# Patient Record
Sex: Female | Born: 1975 | Race: White | Hispanic: No | Marital: Single | Smoking: Former smoker
Health system: Southern US, Community
[De-identification: ages and names within clinical notes are randomized; demographics above are authoritative.]

## PROBLEM LIST (undated history)

## (undated) DIAGNOSIS — Z6791 Unspecified blood type, Rh negative: Secondary | ICD-10-CM

## (undated) DIAGNOSIS — I10 Essential (primary) hypertension: Secondary | ICD-10-CM

## (undated) DIAGNOSIS — O26899 Other specified pregnancy related conditions, unspecified trimester: Secondary | ICD-10-CM

## (undated) HISTORY — DX: Other specified pregnancy related conditions, unspecified trimester: O26.899

## (undated) HISTORY — DX: Unspecified blood type, rh negative: Z67.91

---

## 2006-09-11 ENCOUNTER — Emergency Department: Payer: Self-pay | Admitting: Emergency Medicine

## 2018-02-22 ENCOUNTER — Encounter (HOSPITAL_COMMUNITY): Payer: Self-pay

## 2018-02-22 ENCOUNTER — Ambulatory Visit (HOSPITAL_COMMUNITY)
Admission: AD | Admit: 2018-02-22 | Discharge: 2018-02-23 | Disposition: A | Discharge status: Home / Self Care | Payer: Medicaid Other | Source: Ambulatory Visit | Attending: Obstetrics and Gynecology | Admitting: Obstetrics and Gynecology

## 2018-02-22 DIAGNOSIS — I10 Essential (primary) hypertension: Secondary | ICD-10-CM | POA: Insufficient documentation

## 2018-02-22 DIAGNOSIS — O200 Threatened abortion: Secondary | ICD-10-CM | POA: Diagnosis present

## 2018-02-22 DIAGNOSIS — O3680X0 Pregnancy with inconclusive fetal viability, not applicable or unspecified: Secondary | ICD-10-CM

## 2018-02-22 DIAGNOSIS — O26899 Other specified pregnancy related conditions, unspecified trimester: Secondary | ICD-10-CM

## 2018-02-22 DIAGNOSIS — Z91120 Patient's intentional underdosing of medication regimen due to financial hardship: Secondary | ICD-10-CM | POA: Insufficient documentation

## 2018-02-22 DIAGNOSIS — O034 Incomplete spontaneous abortion without complication: Secondary | ICD-10-CM | POA: Diagnosis present

## 2018-02-22 DIAGNOSIS — Z87891 Personal history of nicotine dependence: Secondary | ICD-10-CM | POA: Insufficient documentation

## 2018-02-22 DIAGNOSIS — O09519 Supervision of elderly primigravida, unspecified trimester: Secondary | ICD-10-CM

## 2018-02-22 DIAGNOSIS — T50906A Underdosing of unspecified drugs, medicaments and biological substances, initial encounter: Secondary | ICD-10-CM | POA: Insufficient documentation

## 2018-02-22 DIAGNOSIS — O209 Hemorrhage in early pregnancy, unspecified: Secondary | ICD-10-CM

## 2018-02-22 DIAGNOSIS — F1210 Cannabis abuse, uncomplicated: Secondary | ICD-10-CM | POA: Diagnosis present

## 2018-02-22 DIAGNOSIS — Z9889 Other specified postprocedural states: Secondary | ICD-10-CM

## 2018-02-22 DIAGNOSIS — Z6791 Unspecified blood type, Rh negative: Secondary | ICD-10-CM

## 2018-02-22 HISTORY — DX: Essential (primary) hypertension: I10

## 2018-02-22 LAB — CBC
HCT: 38.5 % (ref 36.0–46.0)
Hemoglobin: 13.3 g/dL (ref 12.0–15.0)
MCH: 31.1 pg (ref 26.0–34.0)
MCHC: 34.5 g/dL (ref 30.0–36.0)
MCV: 90.2 fL (ref 80.0–100.0)
Platelets: 270 10*3/uL (ref 150–400)
RBC: 4.27 MIL/uL (ref 3.87–5.11)
RDW: 13.1 % (ref 11.5–15.5)
WBC: 12.4 10*3/uL — ABNORMAL HIGH (ref 4.0–10.5)
nRBC: 0 % (ref 0.0–0.2)

## 2018-02-22 LAB — POCT PREGNANCY, URINE: Preg Test, Ur: POSITIVE — AB

## 2018-02-22 MED ORDER — LACTATED RINGERS IV SOLN
INTRAVENOUS | Status: DC
  Administered 2018-02-22 – 2018-02-23 (×2): via INTRAVENOUS

## 2018-02-22 MED ORDER — PROMETHAZINE HCL 25 MG/ML IJ SOLN
12.5000 mg | Freq: Once | INTRAMUSCULAR | Status: AC
  Administered 2018-02-22: 12.5 mg via INTRAVENOUS
  Filled 2018-02-22: qty 1

## 2018-02-22 MED ORDER — HYDROMORPHONE HCL 1 MG/ML IJ SOLN
1.0000 mg | INTRAMUSCULAR | Status: DC | PRN
  Administered 2018-02-22: 1 mg via INTRAVENOUS
  Filled 2018-02-22: qty 1

## 2018-02-22 NOTE — MAU Note (Signed)
Vaginal bleeding and abdominal cramping that started around 1900.  States she is about [redacted] weeks pregnant.  LMP end of July/beginning of August.

## 2018-02-22 NOTE — MAU Provider Note (Signed)
First Provider Initiated Contact with Patient 02/22/18 2247      Chief Complaint:  Vaginal Bleeding and Abdominal Pain   Vanessa Simmons is  42 y.o. G1P0010 at Unknown presents complaining of Vaginal Bleeding and Abdominal Pain . She is appx [redacted] weeks pregnant by LMP, hasn't had an [place] yet.  Started spotting this am, wasn't too concerned.  THen around 1900, she started having severe intermittent abdominal cramping and the bleeding increased. Also having some nausea and vomiting. Has CHTN, not on meds "for a while" d/t not having insurance. Had a TAB in 2000.   Obstetrical/Gynecological History: OB History    Gravida  1   Para      Term      Preterm      AB  1   Living        SAB      TAB  1   Ectopic      Multiple      Live Births             Past Medical History: Past Medical History:  Diagnosis Date  . Hypertension     Past Surgical History: History reviewed. No pertinent surgical history.  Family History: No family history on file.  Social History: Social History   Tobacco Use  . Smoking status: Not on file  Substance Use Topics  . Alcohol use: Not on file  . Drug use: Not on file    Allergies: Not on File  Meds:  No medications prior to admission.    Review of Systems   Constitutional: Negative for fever and chills Eyes: Negative for visual disturbances Respiratory: Negative for shortness of breath, dyspnea Cardiovascular: Negative for chest pain or palpitations  Gastrointestinal: Negative for diarrhea or constipation Genitourinary: Negative for dysuria and urgency Musculoskeletal: Negative for back pain, joint pain, myalgias.  Normal ROM  Neurological: Negative for dizziness and headaches    Physical Exam  Blood pressure (!) 159/98, pulse 69, temperature 97.9 F (36.6 C), resp. rate 20, height 5\' 5"  (1.651 m), weight 77.7 kg. GENERAL: Well-developed, well-nourished female in no acute distress.  LUNGS: respiratory effort  unlabored HEART: Regular rate and rhythm. ABDOMEN: Soft, nontender EXTREMITIES: Nontender, no edema, 2+ distal pulses. DTR's 2+ PELVIC:  Moderate amount of bright red blood on inner thighs and vagina.  SSE:  No active bleeding noted; clots of bright red blood in vault.  CERVICAL EXAM: firm, anterior, closed.   Labs: No results found for this or any previous visit (from the past 24 hour(s)). Imaging Studies:  No results found.  Assessment: Vanessa Simmons is  42 y.o. G1P0010 a appx 9 weeks, empty sac.  Still bleeding, Dr Vergie Living called to assess. .  Plan: counseled pt for [facility]  Scarlette Calico Cresenzo-Dishmon 10/17/201911:11 PM

## 2018-02-23 ENCOUNTER — Inpatient Hospital Stay (HOSPITAL_COMMUNITY): Payer: Medicaid Other | Admitting: Anesthesiology

## 2018-02-23 ENCOUNTER — Inpatient Hospital Stay (HOSPITAL_COMMUNITY): Payer: Medicaid Other

## 2018-02-23 ENCOUNTER — Encounter (HOSPITAL_COMMUNITY)
Admission: AD | Disposition: A | Discharge status: Home / Self Care | Payer: Self-pay | Source: Ambulatory Visit | Attending: Obstetrics and Gynecology

## 2018-02-23 DIAGNOSIS — Z6791 Unspecified blood type, Rh negative: Secondary | ICD-10-CM

## 2018-02-23 DIAGNOSIS — I10 Essential (primary) hypertension: Secondary | ICD-10-CM | POA: Diagnosis not present

## 2018-02-23 DIAGNOSIS — O034 Incomplete spontaneous abortion without complication: Secondary | ICD-10-CM

## 2018-02-23 DIAGNOSIS — O200 Threatened abortion: Secondary | ICD-10-CM | POA: Diagnosis present

## 2018-02-23 DIAGNOSIS — O09519 Supervision of elderly primigravida, unspecified trimester: Secondary | ICD-10-CM

## 2018-02-23 DIAGNOSIS — F1210 Cannabis abuse, uncomplicated: Secondary | ICD-10-CM | POA: Diagnosis present

## 2018-02-23 DIAGNOSIS — O3680X0 Pregnancy with inconclusive fetal viability, not applicable or unspecified: Secondary | ICD-10-CM

## 2018-02-23 DIAGNOSIS — T50906A Underdosing of unspecified drugs, medicaments and biological substances, initial encounter: Secondary | ICD-10-CM | POA: Diagnosis not present

## 2018-02-23 DIAGNOSIS — Z91120 Patient's intentional underdosing of medication regimen due to financial hardship: Secondary | ICD-10-CM | POA: Diagnosis not present

## 2018-02-23 DIAGNOSIS — O26899 Other specified pregnancy related conditions, unspecified trimester: Secondary | ICD-10-CM

## 2018-02-23 HISTORY — DX: Unspecified blood type, rh negative: Z67.91

## 2018-02-23 HISTORY — PX: DILATION AND EVACUATION: SHX1459

## 2018-02-23 LAB — URINALYSIS, ROUTINE W REFLEX MICROSCOPIC
Bacteria, UA: NONE SEEN
Bilirubin Urine: NEGATIVE
GLUCOSE, UA: NEGATIVE mg/dL
Ketones, ur: 20 mg/dL — AB
LEUKOCYTES UA: NEGATIVE
NITRITE: NEGATIVE
PROTEIN: NEGATIVE mg/dL
Specific Gravity, Urine: 1.025 (ref 1.005–1.030)
pH: 5 (ref 5.0–8.0)

## 2018-02-23 LAB — ABO/RH: ABO/RH(D): A NEG

## 2018-02-23 LAB — RAPID URINE DRUG SCREEN, HOSP PERFORMED
Amphetamines: NOT DETECTED
BARBITURATES: NOT DETECTED
BENZODIAZEPINES: NOT DETECTED
Cocaine: NOT DETECTED
Opiates: NOT DETECTED
Tetrahydrocannabinol: POSITIVE — AB

## 2018-02-23 LAB — HCG, QUANTITATIVE, PREGNANCY: HCG, BETA CHAIN, QUANT, S: 3354 m[IU]/mL — AB (ref <5)

## 2018-02-23 LAB — TYPE AND SCREEN
ABO/RH(D): A NEG
Antibody Screen: NEGATIVE

## 2018-02-23 SURGERY — DILATION AND EVACUATION, UTERUS
Anesthesia: General | Site: Vagina

## 2018-02-23 MED ORDER — PROPOFOL 10 MG/ML IV BOLUS
INTRAVENOUS | Status: DC | PRN
  Administered 2018-02-23: 150 mg via INTRAVENOUS

## 2018-02-23 MED ORDER — ACETAMINOPHEN 500 MG PO TABS: 500.0000 mg | ORAL_TABLET | Freq: Four times a day (QID) | ORAL | 0 refills | Status: DC | PRN

## 2018-02-23 MED ORDER — FENTANYL CITRATE (PF) 100 MCG/2ML IJ SOLN
INTRAMUSCULAR | Status: DC | PRN
  Administered 2018-02-23: 25 ug via INTRAVENOUS

## 2018-02-23 MED ORDER — DEXAMETHASONE SODIUM PHOSPHATE 10 MG/ML IJ SOLN
INTRAMUSCULAR | Status: DC | PRN
  Administered 2018-02-23: 4 mg via INTRAVENOUS

## 2018-02-23 MED ORDER — LIDOCAINE HCL 1 % IJ SOLN
INTRAMUSCULAR | Status: DC | PRN
  Administered 2018-02-23: 20 mL

## 2018-02-23 MED ORDER — LIDOCAINE HCL (CARDIAC) PF 100 MG/5ML IV SOSY
PREFILLED_SYRINGE | INTRAVENOUS | Status: AC
  Filled 2018-02-23: qty 5

## 2018-02-23 MED ORDER — ONDANSETRON HCL 4 MG/2ML IJ SOLN
INTRAMUSCULAR | Status: DC | PRN
  Administered 2018-02-23: 4 mg via INTRAVENOUS

## 2018-02-23 MED ORDER — MIDAZOLAM HCL 2 MG/2ML IJ SOLN
INTRAMUSCULAR | Status: AC
  Filled 2018-02-23: qty 2

## 2018-02-23 MED ORDER — MIDAZOLAM HCL 2 MG/2ML IJ SOLN
INTRAMUSCULAR | Status: DC | PRN
  Administered 2018-02-23: 2 mg via INTRAVENOUS

## 2018-02-23 MED ORDER — OXYCODONE-ACETAMINOPHEN 5-325 MG PO TABS: 1.0000 | ORAL_TABLET | Freq: Four times a day (QID) | ORAL | 0 refills | Status: DC | PRN

## 2018-02-23 MED ORDER — HYDROMORPHONE HCL 1 MG/ML IJ SOLN: 0.2500 mg | INTRAMUSCULAR | Status: DC | PRN

## 2018-02-23 MED ORDER — SUCCINYLCHOLINE CHLORIDE 200 MG/10ML IV SOSY
PREFILLED_SYRINGE | INTRAVENOUS | Status: AC
  Filled 2018-02-23: qty 10

## 2018-02-23 MED ORDER — FENTANYL CITRATE (PF) 100 MCG/2ML IJ SOLN: 50.0000 ug | Freq: Once | INTRAMUSCULAR | Status: DC

## 2018-02-23 MED ORDER — EPHEDRINE SULFATE 50 MG/ML IJ SOLN
INTRAMUSCULAR | Status: DC | PRN
  Administered 2018-02-23 (×2): 5 mg via INTRAVENOUS

## 2018-02-23 MED ORDER — PROPOFOL 10 MG/ML IV BOLUS
INTRAVENOUS | Status: AC
  Filled 2018-02-23: qty 20

## 2018-02-23 MED ORDER — FENTANYL CITRATE (PF) 100 MCG/2ML IJ SOLN
INTRAMUSCULAR | Status: AC
  Filled 2018-02-23: qty 2

## 2018-02-23 MED ORDER — ONDANSETRON HCL 4 MG/2ML IJ SOLN
INTRAMUSCULAR | Status: AC
  Filled 2018-02-23: qty 2

## 2018-02-23 MED ORDER — RHO D IMMUNE GLOBULIN 1500 UNIT/2ML IJ SOSY
300.0000 ug | PREFILLED_SYRINGE | Freq: Once | INTRAMUSCULAR | Status: AC
  Administered 2018-02-23: 300 ug via INTRAVENOUS
  Filled 2018-02-23: qty 2

## 2018-02-23 MED ORDER — IBUPROFEN 600 MG PO TABS: 600.0000 mg | ORAL_TABLET | Freq: Four times a day (QID) | ORAL | 3 refills | Status: AC | PRN

## 2018-02-23 MED ORDER — EPHEDRINE 5 MG/ML INJ
INTRAVENOUS | Status: AC
  Filled 2018-02-23: qty 10

## 2018-02-23 MED ORDER — LIDOCAINE HCL (CARDIAC) PF 100 MG/5ML IV SOSY
PREFILLED_SYRINGE | INTRAVENOUS | Status: DC | PRN
  Administered 2018-02-23: 100 mg via INTRAVENOUS

## 2018-02-23 MED ORDER — DEXAMETHASONE SODIUM PHOSPHATE 4 MG/ML IJ SOLN
INTRAMUSCULAR | Status: AC
  Filled 2018-02-23: qty 1

## 2018-02-23 MED ORDER — SODIUM CHLORIDE 0.9 % IV SOLN
100.0000 mg | Freq: Once | INTRAVENOUS | Status: AC
  Administered 2018-02-23 (×2): 100 mg via INTRAVENOUS
  Filled 2018-02-23: qty 100

## 2018-02-23 MED ORDER — SUCCINYLCHOLINE CHLORIDE 20 MG/ML IJ SOLN
INTRAMUSCULAR | Status: DC | PRN
  Administered 2018-02-23: 200 mg via INTRAVENOUS

## 2018-02-23 SURGICAL SUPPLY — 21 items
CATH ROBINSON RED A/P 16FR (CATHETERS) IMPLANT
DECANTER SPIKE VIAL GLASS SM (MISCELLANEOUS) ×3 IMPLANT
GLOVE BIOGEL PI IND STRL 7.0 (GLOVE) ×1 IMPLANT
GLOVE BIOGEL PI IND STRL 7.5 (GLOVE) ×1 IMPLANT
GLOVE BIOGEL PI INDICATOR 7.0 (GLOVE) ×2
GLOVE BIOGEL PI INDICATOR 7.5 (GLOVE) ×2
GLOVE NEODERM STER SZ 7 (GLOVE) ×3 IMPLANT
GOWN STRL REUS W/TWL LRG LVL3 (GOWN DISPOSABLE) ×3 IMPLANT
GOWN STRL REUS W/TWL XL LVL3 (GOWN DISPOSABLE) ×3 IMPLANT
KIT BERKELEY 1ST TRIMESTER 3/8 (MISCELLANEOUS) ×3 IMPLANT
NS IRRIG 1000ML POUR BTL (IV SOLUTION) ×3 IMPLANT
PACK VAGINAL MINOR WOMEN LF (CUSTOM PROCEDURE TRAY) ×3 IMPLANT
PAD OB MATERNITY 4.3X12.25 (PERSONAL CARE ITEMS) ×3 IMPLANT
PAD PREP 24X48 CUFFED NSTRL (MISCELLANEOUS) ×3 IMPLANT
SET BERKELEY SUCTION TUBING (SUCTIONS) ×3 IMPLANT
TOWEL OR 17X24 6PK STRL BLUE (TOWEL DISPOSABLE) ×6 IMPLANT
VACURETTE 10 RIGID CVD (CANNULA) ×3 IMPLANT
VACURETTE 12 RIGID CVD (CANNULA) ×3 IMPLANT
VACURETTE 7MM CVD STRL WRAP (CANNULA) IMPLANT
VACURETTE 8 RIGID CVD (CANNULA) IMPLANT
VACURETTE 9 RIGID CVD (CANNULA) IMPLANT

## 2018-02-23 NOTE — Anesthesia Procedure Notes (Signed)
Procedure Name: Intubation Date/Time: 02/23/2018 1:55 AM Performed by: Genevie Ann, CRNA Pre-anesthesia Checklist: Patient identified, Emergency Drugs available, Suction available and Patient being monitored Patient Re-evaluated:Patient Re-evaluated prior to induction Oxygen Delivery Method: Circle system utilized Preoxygenation: Pre-oxygenation with 100% oxygen Induction Type: IV induction and Rapid sequence Laryngoscope Size: Mac, Glidescope and 3 Grade View: Grade I Tube type: Oral Tube size: 7.0 mm Number of attempts: 1 Placement Confirmation: ETT inserted through vocal cords under direct vision,  breath sounds checked- equal and bilateral and positive ETCO2 Secured at: 21 cm Dental Injury: Teeth and Oropharynx as per pre-operative assessment

## 2018-02-23 NOTE — MAU Note (Signed)
Vanessa Simmons in PACU notified that Rhogam is ready for pick up in blood bank.

## 2018-02-23 NOTE — MAU Note (Addendum)
MAU Note  Patient back from u/s and 9wk (based on mean sac diameter) incomplete AB seen. Admit CBC normal. I repeated her exam after repeat VS (normal and stable) and in the vaginal vault approximately 25mL of blood clot seen and some scant tissue and clot seen in the cervical canal. Not bleeding heavy but steady enough that I recommended proceeding with suction d&c, which patient is amenable to, when OR can assemble staff. D/w House coverage and anesthesia. Last meal at 1730 but has had sips of water in MAU but also has thrown up in MAU too. Will need rhogam before discharge and doxycycline before surgery  Cornelia Copa MD Attending Center for [facility] (Faculty Practice) 02/23/2018 Time: [phone]

## 2018-02-23 NOTE — H&P (Signed)
First Provider Initiated Contact with Patient 02/22/18 2247      Chief Complaint:  Vaginal Bleeding and Abdominal Pain   Vanessa Simmons is  42 y.o. G1P0010 at Unknown presents complaining of Vaginal Bleeding and Abdominal Pain . She is appx [redacted] weeks pregnant by LMP, hasn't had an [place] yet.  Started spotting this am, wasn't too concerned.  THen around 1900, she started having severe intermittent abdominal cramping and the bleeding increased. Also having some nausea and vomiting. Has CHTN, not on meds "for a while" d/t not having insurance. Had a TAB in 2000.   Obstetrical/Gynecological History: OB History    Gravida  1   Para      Term      Preterm      AB  1   Living        SAB      TAB  1   Ectopic      Multiple      Live Births             Past Medical History: Past Medical History:  Diagnosis Date  . Hypertension     Past Surgical History: History reviewed. No pertinent surgical history.  Family History: No family history on file.  Social History: Social History   Tobacco Use  . Smoking status: Not on file  Substance Use Topics  . Alcohol use: Not on file  . Drug use: Not on file    Allergies: Not on File  Meds:  No medications prior to admission.    Review of Systems   Constitutional: Negative for fever and chills Eyes: Negative for visual disturbances Respiratory: Negative for shortness of breath, dyspnea Cardiovascular: Negative for chest pain or palpitations  Gastrointestinal: Negative for diarrhea or constipation Genitourinary: Negative for dysuria and urgency Musculoskeletal: Negative for back pain, joint pain, myalgias.  Normal ROM  Neurological: Negative for dizziness and headaches    Physical Exam  Blood pressure (!) 159/98, pulse 69, temperature 97.9 F (36.6 C), resp. rate 20, height 5\' 5"  (1.651 m), weight 77.7 kg. GENERAL: Well-developed, well-nourished female in no acute distress.  LUNGS: respiratory effort  unlabored HEART: Regular rate and rhythm. ABDOMEN: Soft, nontender EXTREMITIES: Nontender, no edema, 2+ distal pulses. DTR's 2+ PELVIC:  Moderate amount of bright red blood on inner thighs and vagina.  SSE:  No active bleeding noted; clots of bright red blood in vault.  CERVICAL EXAM: firm, anterior, closed.   Labs: No results found for this or any previous visit (from the past 24 hour(s)). Imaging Studies:  No results found.  Assessment: Vanessa Simmons is  42 y.o. G1P0010 a appx 9 weeks, empty sac.  Still bleeding, Dr Vergie Living called to assess. .  Plan: counseled pt for D&C  Jacklyn Shell 10/17/201911:11 PM    Cornelia Copa MD Attending Center for [employer] (Faculty Practice) 02/23/2018

## 2018-02-23 NOTE — Discharge Instructions (Signed)
   We will discuss your surgery once again in detail at your post-op visit in two to four weeks. If you haven't already done so, please call to make your appointment as soon as possible.  Dilation and Curettage or Vacuum Curettage, Care After These instructions give you information on caring for yourself after your procedure. Your doctor may also give you more specific instructions. Call your doctor if you have any problems or questions after your procedure. HOME CARE Do not drive for 24 hours. Wait 1 week before doing any activities that wear you out. Do not stand for a long time. Limit stair climbing to once or twice a day. Rest often. Continue with your usual diet. Drink enough fluids to keep your pee (urine) clear or pale yellow. If you have a hard time pooping (constipation), you may: Take a medicine to help you go poop (laxative) as told by your doctor. Eat more fruit and bran. Drink more fluids. Take showers, not baths, for as long as told by your doctor. Do not swim or use a hot tub until your doctor says it is okay. Have someone with you for 1day after the procedure. Do not douche, use tampons, or have sex (intercourse) until seen by your doctor Only take medicines as told by your doctor. Do not take aspirin. It can cause bleeding. Keep all doctor visits. GET HELP IF: You have cramps or pain not helped by medicine. You have new pain in the belly (abdomen). You have a bad smelling fluid coming from your vagina. You have a rash. You have problems with any medicine. GET HELP RIGHT AWAY IF:  You start to bleed more than a regular period. You have a fever. You have chest pain. You have trouble breathing. You feel dizzy or feel like passing out (fainting). You pass out. You have pain in the tops of your shoulders. You have vaginal bleeding with or without clumps of blood (blood clots). MAKE SURE YOU: Understand these instructions. Will watch your condition. Will get help  right away if you are not doing well or get worse. Document Released: 02/02/2008 Document Revised: 04/30/2013 Document Reviewed: 11/22/2012 ExitCare Patient Information 2015 [employer]. This information is not intended to replace advice given to you by your health care provider. Make sure you discuss any questions you have with your health care provider.   

## 2018-02-23 NOTE — MAU Note (Signed)
Vanessa Simmons in PACU notified that pt needs to receive Rhogam.

## 2018-02-23 NOTE — Transfer of Care (Signed)
Immediate Anesthesia Transfer of Care Note  Patient: Vanessa Simmons  Procedure(s) Performed: DILATATION AND EVACUATION (N/A Vagina )  Patient Location: PACU  Anesthesia Type:General  Level of Consciousness: awake, alert  and oriented  Airway & Oxygen Therapy: Patient Spontanous Breathing  Post-op Assessment: Report given to RN and Post -op Vital signs reviewed and stable  Post vital signs: Reviewed and stable  Last Vitals:  Vitals Value Taken Time  BP 127/80 02/23/2018  2:32 AM  Temp    Pulse 95 02/23/2018  2:34 AM  Resp    SpO2 100 % 02/23/2018  2:34 AM  Vitals shown include unvalidated device data.  Last Pain:  Vitals:   02/23/18 0129  PainSc: 9          Complications: No apparent anesthesia complications

## 2018-02-23 NOTE — Anesthesia Preprocedure Evaluation (Addendum)
Anesthesia Evaluation  Patient identified by MRN, date of birth, ID band Patient awake    Reviewed: Allergy & Precautions, NPO status , Patient's Chart, lab work & pertinent test results  Airway Mallampati: II  TM Distance: >3 FB Neck ROM: Full    Dental no notable dental hx. (+) Teeth Intact, Dental Advisory Given   Pulmonary neg pulmonary ROS, former smoker,    Pulmonary exam normal breath sounds clear to auscultation       Cardiovascular hypertension, Normal cardiovascular exam Rhythm:Regular Rate:Normal     Neuro/Psych negative neurological ROS  negative psych ROS   GI/Hepatic negative GI ROS, Neg liver ROS,   Endo/Other  negative endocrine ROS  Renal/GU negative Renal ROS  negative genitourinary   Musculoskeletal negative musculoskeletal ROS (+)   Abdominal   Peds  Hematology negative hematology ROS (+)   Anesthesia Other Findings Missed ab c/b bleeding  Reproductive/Obstetrics                            Anesthesia Physical Anesthesia Plan  ASA: I and emergent  Anesthesia Plan: General   Post-op Pain Management:    Induction: Intravenous  PONV Risk Score and Plan: 3 and Ondansetron, Dexamethasone and Midazolam  Airway Management Planned: Oral ETT and Video Laryngoscope Planned  Additional Equipment:   Intra-op Plan:   Post-operative Plan: Extubation in OR  Informed Consent: I have reviewed the patients History and Physical, chart, labs and discussed the procedure including the risks, benefits and alternatives for the proposed anesthesia with the patient or authorized representative who has indicated his/her understanding and acceptance.   Dental advisory given  Plan Discussed with: CRNA  Anesthesia Plan Comments: (Last ate at 7pm. Sips of water while in MAU. Plan for RSI with glidescope and ETT. )        Anesthesia Quick Evaluation

## 2018-02-23 NOTE — Op Note (Signed)
Operative Note   02/23/2018  PRE-OP DIAGNOSIS: Incomplete abortion at 9wks based on mean sac diameter. Rh negative. AMA   POST-OP DIAGNOSIS: Same  SURGEON: Surgeon(s) and Role:    Platinum Bing, MD - Primary  ASSISTANT: None  PROCEDURE:  Suction dilation and curettage  ANESTHESIA: Monitor Anesthesia Care and paracervical block  ESTIMATED BLOOD LOSS: 25mL  DRAINS: per anesthesia note  TOTAL IV FLUIDS: per anesthesia note  SPECIMENS: products of conception to pathology  VTE PROPHYLAXIS: SCDs to the bilateral lower extremities  ANTIBIOTICS: Doxycycline 100mg  IV x 1 pre op  COMPLICATIONS: none  DISPOSITION: PACU - hemodynamically stable.  CONDITION: stable  BLOOD TYPE: A NEG. Rhogam given:yes, in the PACU  FINDINGS: Exam under anesthesia revealed 10 week sized uterus with no masses and bilateral adnexa without masses or fullness. Necrotic appearing products of conception were seen, with gritty texture in all four quadrants at the end of the procedure.   PROCEDURE IN DETAIL:  After informed consent was obtained, the patient was taken to the operating room where anesthesia was obtained without difficulty. The patient was positioned in the dorsal lithotomy position in [place] stirrups. The patient was examined under anesthesia, with the above noted findings.  The bi-valved speculum was placed inside the patient's vagina, and the the anterior lip of the cervix was seen and grasped with the tenaculum.  A paracervical block was achieved with 20mL of 1% lidocaine and then the cervix was already dilated to pass a 23 French-Pratt dilator.  The suction was then calibrated to and connected to the number 12 cannula, which was then introduced with the above noted findings. A gentle curettage was done at the end and yield no products of conception.   The suction was then done one more time to remove any remaining curettage material.   Excellent hemostasis was noted, and all  instruments were removed, with excellent hemostasis noted throughout.  She was then taken out of dorsal lithotomy. The patient tolerated the procedure well.  Sponge, lap and instrument counts were correct x2.  The patient was taken to recovery room in excellent condition.  Cornelia Copa MD Attending Center for [employer] [facility])

## 2018-02-24 LAB — RH IG WORKUP (INCLUDES ABO/RH)
ABO/RH(D): A NEG
Gestational Age(Wks): 9
Unit division: 0

## 2018-02-25 NOTE — Anesthesia Postprocedure Evaluation (Signed)
Anesthesia Post Note  Patient: Vanessa Simmons  Procedure(s) Performed: DILATATION AND EVACUATION (N/A Vagina )     Patient location during evaluation: PACU Anesthesia Type: General Level of consciousness: awake and alert Pain management: pain level controlled Vital Signs Assessment: post-procedure vital signs reviewed and stable Respiratory status: spontaneous breathing, nonlabored ventilation, respiratory function stable and patient connected to nasal cannula oxygen Cardiovascular status: blood pressure returned to baseline and stable Postop Assessment: no apparent nausea or vomiting Anesthetic complications: no    Last Vitals:  Vitals:   02/23/18 0330 02/23/18 0345  BP: 108/66 109/63  Pulse: 66 65  Resp: 13 14  Temp: 36.8 C   SpO2: 97% 97%    Last Pain:  Vitals:   02/23/18 0330  PainSc: 2                  Dorrene Bently L Aala Ransom

## 2018-02-26 ENCOUNTER — Encounter (HOSPITAL_COMMUNITY): Payer: Self-pay | Admitting: Obstetrics and Gynecology

## 2018-03-21 ENCOUNTER — Encounter: Payer: Self-pay | Admitting: Family Medicine

## 2018-03-21 ENCOUNTER — Encounter: Payer: Self-pay | Admitting: Obstetrics and Gynecology

## 2018-03-21 ENCOUNTER — Ambulatory Visit (INDEPENDENT_AMBULATORY_CARE_PROVIDER_SITE_OTHER): Payer: Medicaid Other | Admitting: Obstetrics and Gynecology

## 2018-03-21 VITALS — BP 167/103 | HR 70 | Wt 175.4 lb

## 2018-03-21 DIAGNOSIS — R030 Elevated blood-pressure reading, without diagnosis of hypertension: Secondary | ICD-10-CM

## 2018-03-21 DIAGNOSIS — Z09 Encounter for follow-up examination after completed treatment for conditions other than malignant neoplasm: Secondary | ICD-10-CM

## 2018-03-21 MED ORDER — FOLIC ACID 1 MG PO TABS: 1.0000 mg | ORAL_TABLET | Freq: Every day | ORAL | 10 refills | Status: AC

## 2018-03-21 NOTE — Progress Notes (Signed)
Pt declined speaking with integrated behavioral health clinician.  

## 2018-03-21 NOTE — Patient Instructions (Addendum)
If no period by December 1, take a home pregnancy test and let us know. Wait until you have a period before trying again.

## 2018-03-21 NOTE — Progress Notes (Addendum)
   Obstetrics and Gynecology Visit Return Patient Evaluation  Appointment Date: 03/21/2018  Primary Care Provider: Patient, No Pcp Per  [facility]  Chief Complaint: post op follow up for suction d&c for incomplete AB at approx 9wks  History of Present Illness:  Vanessa Simmons is a 42 y.o. is s/p the above procedure on 10/18; pt was discharged from the pacu.  No bleeding since approx 2 days after surgery, no pain. Path was negative.   She denies any chest pain, sob, visual changes  Review of Systems:as noted in the History of Present Illness.  Medications:  None  Allergies: has No Known Allergies.  Physical Exam:  BP (!) 167/103   Pulse 70   Wt 175 lb 6.4 oz (79.6 kg)   LMP 12/03/2017   Breastfeeding? Unknown   BMI 29.19 kg/m  Body mass index is 29.19 kg/m. General appearance: Well nourished, well developed female in no acute distress.  Abdomen: diffusely non tender to palpation, non distended, and no masses, hernias Neuro/Psych:  Normal mood and affect.    Patient declines pelvic exam  Assessment: pt doing well  Plan: D/w her that if no period by early December to take a home UPT and to call us with results.   They would like to try again. I told her I recommend waiting for a period first and finding a PCP. Also recommend she start a MVI or 1mg  of folic acid while wanting to conceive.   Resources given re: PCP since she doesn't have insurance. She states that she was on what sounds like HCTZ in the past. [facility] information given to patient.    RTC: PRN  Cornelia Copaharlie Stone Spirito, Jr MD Attending Center [facility](Faculty Practice)

## 2018-03-22 ENCOUNTER — Encounter: Payer: Self-pay | Admitting: Obstetrics and Gynecology

## 2018-03-22 DIAGNOSIS — I10 Essential (primary) hypertension: Secondary | ICD-10-CM | POA: Insufficient documentation

## 2018-03-22 DIAGNOSIS — R030 Elevated blood-pressure reading, without diagnosis of hypertension: Secondary | ICD-10-CM

## 2018-05-04 ENCOUNTER — Ambulatory Visit: Payer: Medicaid Other | Admitting: Family Medicine

## 2018-09-24 ENCOUNTER — Ambulatory Visit (INDEPENDENT_AMBULATORY_CARE_PROVIDER_SITE_OTHER): Payer: Medicaid Other | Admitting: Obstetrics and Gynecology

## 2018-09-24 ENCOUNTER — Encounter: Payer: Self-pay | Admitting: Obstetrics and Gynecology

## 2018-09-24 ENCOUNTER — Other Ambulatory Visit: Payer: Self-pay

## 2018-09-24 VITALS — BP 187/116 | HR 75 | Wt 191.9 lb

## 2018-09-24 DIAGNOSIS — O360130 Maternal care for anti-D [Rh] antibodies, third trimester, not applicable or unspecified: Secondary | ICD-10-CM

## 2018-09-24 DIAGNOSIS — O209 Hemorrhage in early pregnancy, unspecified: Secondary | ICD-10-CM | POA: Diagnosis not present

## 2018-09-24 DIAGNOSIS — O200 Threatened abortion: Secondary | ICD-10-CM

## 2018-09-24 DIAGNOSIS — I10 Essential (primary) hypertension: Secondary | ICD-10-CM

## 2018-09-24 DIAGNOSIS — Z6791 Unspecified blood type, Rh negative: Secondary | ICD-10-CM

## 2018-09-24 MED ORDER — LABETALOL HCL 200 MG PO TABS: 200.0000 mg | ORAL_TABLET | Freq: Two times a day (BID) | ORAL | 3 refills | Status: AC

## 2018-09-24 MED ORDER — RHO D IMMUNE GLOBULIN 1500 UNIT/2ML IJ SOSY
300.0000 ug | PREFILLED_SYRINGE | Freq: Once | INTRAMUSCULAR | Status: AC
  Administered 2018-09-24: 300 ug via INTRAMUSCULAR

## 2018-09-24 NOTE — Patient Instructions (Signed)
Threatened Miscarriage    A threatened miscarriage is when you have bleeding from your vagina during the first 20 weeks of pregnancy but the pregnancy does not end. Your doctor will do tests to make sure you are still pregnant. The cause of the bleeding may not be known. This condition does not mean your pregnancy will end, but it does increase the risk that it will end (complete miscarriage).  Follow these instructions at home:  · Get plenty of rest.  · If you have bleeding in your vagina, do not have sex or use tampons.  · Do not douche.  · Do not smoke or use drugs.  · Do not drink alcohol.  · Avoid caffeine.  · Keep all follow-up prenatal visits as told by your doctor. This is important.  Contact a doctor if:  · You have light bleeding from your vagina.  · You have belly pain or cramping.  · You have a fever.  Get help right away if:  · You have heavy bleeding from your vagina.  · You have clots of blood coming from your vagina.  · You pass tissue from your vagina.  · You have a gush of fluid from your vagina.  · You are leaking fluid from your vagina.  · You have very bad pain or cramps in your low back or belly.  · You have fever, chills, and very bad belly pain.  Summary  · A threatened miscarriage is when you have bleeding from your vagina during the first 20 weeks of pregnancy but the pregnancy does not end.  · This condition does not mean your pregnancy will end, but it does increase the risk that it will end (complete miscarriage).  · Get plenty of rest. If you have bleeding in your vagina, do not have sex or use tampons.  · Keep all follow-up prenatal visits as told by your doctor. This is important.  This information is not intended to replace advice given to you by your health care provider. Make sure you discuss any questions you have with your health care provider.  Document Released: 04/07/2008 Document Revised: 07/22/2016 Document Reviewed: 07/22/2016  Elsevier Interactive Patient Education © 2019  Elsevier [employer]

## 2018-09-24 NOTE — Progress Notes (Signed)
Vanessa Simmons presents for eval of first trimester bleeding vs miscarrige. LMP first of April + home UPT on 09/16/18 Started bleeding and having pain. Pain has resolved but still having some bleeding H/O SAB x 2  Last in 10/19 requiring a D & C A negative H/O CHTN, no PCP or meds  PE AF BP 187/116 Lungs clear Heart RRR Abd soft + BS GU Nl EGBUS scant blood noted in vagina, cervix closed uterus small < 8 weeks non tender no masses  A/P First trimester vaginal bleeding vs threaten miscarriage        CHTN  Discussed with pt. Will check BHCG. Rhogam d/t Rh negative Start Labetalol. F/U per lab results

## 2018-09-24 NOTE — Progress Notes (Signed)
LMP was in beginning of April and had a positive upt on 09/11/2018. States she miscarried past weekend. States that she spoke with a nurse and was advised to come into the office. States on Friday she bled pretty bad and cramps . Will do a beta hcg today in office and check patients blood pressure. Will give patient Rhogam today based off of blood typing.

## 2018-09-25 ENCOUNTER — Telehealth (INDEPENDENT_AMBULATORY_CARE_PROVIDER_SITE_OTHER): Payer: Medicaid Other | Admitting: *Deleted

## 2018-09-25 DIAGNOSIS — O209 Hemorrhage in early pregnancy, unspecified: Secondary | ICD-10-CM

## 2018-09-25 LAB — BETA HCG QUANT (REF LAB): hCG Quant: 11 m[IU]/mL

## 2018-09-25 NOTE — Telephone Encounter (Signed)
-----   Message from Hermina Staggers, MD sent at 09/25/2018  9:39 AM EDT ----- Please let Ms. Daher know that her pregnancy hormonal level is low. Unable to determine at this time if she has miscarried. Will need to follow hormonal levels as we discussed at yesterday's appt. Continue with BP medication at this time Repeat BHCG in 1 week, routine. Thanks Casimiro Needle

## 2018-09-25 NOTE — Telephone Encounter (Signed)
Called pt to inform her about her bhcg as advised by Dr. Alysia Penna.  Pt verbalized understanding and states she can come for a bhcg lab draw around 0900 on Tuesday 5/26.  Pt verbalized understanding to continue taking her BP medication.

## 2018-10-03 ENCOUNTER — Other Ambulatory Visit: Payer: Medicaid Other

## 2018-10-03 ENCOUNTER — Other Ambulatory Visit: Payer: Self-pay | Admitting: *Deleted

## 2018-10-03 ENCOUNTER — Other Ambulatory Visit: Payer: Self-pay

## 2018-10-03 DIAGNOSIS — O200 Threatened abortion: Secondary | ICD-10-CM

## 2018-10-04 ENCOUNTER — Telehealth (INDEPENDENT_AMBULATORY_CARE_PROVIDER_SITE_OTHER): Payer: Medicaid Other | Admitting: *Deleted

## 2018-10-04 DIAGNOSIS — O209 Hemorrhage in early pregnancy, unspecified: Secondary | ICD-10-CM

## 2018-10-04 LAB — BETA HCG QUANT (REF LAB): hCG Quant: <1 m[IU]/mL

## 2018-10-04 NOTE — Telephone Encounter (Signed)
-----   Message from Hermina Staggers, MD sent at 10/04/2018 10:37 AM EDT ----- Please let pt know that she has completed her miscarriage  No further blood work is needed. She needs to see her PCP for BP management Thanks Casimiro Needle

## 2018-10-04 NOTE — Telephone Encounter (Signed)
Called pt to inform her that she completed her miscarriage and that she no longer needs to come in for blood work.  Also advised her that the provider recommended that she see her PCP for management of her blood pressure.  Pt verbalized understanding.

## 2020-04-09 ENCOUNTER — Other Ambulatory Visit: Payer: Self-pay | Admitting: Endocrinology

## 2020-04-09 DIAGNOSIS — Z1231 Encounter for screening mammogram for malignant neoplasm of breast: Secondary | ICD-10-CM

## 2020-05-21 ENCOUNTER — Ambulatory Visit: Payer: Medicaid Other

## 2020-06-15 ENCOUNTER — Ambulatory Visit: Payer: Medicaid Other | Admitting: Podiatry

## 2020-06-19 ENCOUNTER — Ambulatory Visit: Payer: Medicaid Other | Admitting: Podiatry

## 2020-06-24 ENCOUNTER — Ambulatory Visit: Payer: Medicaid Other | Admitting: Podiatry

## 2020-07-10 ENCOUNTER — Inpatient Hospital Stay: Admission: RE | Admit: 2020-07-10 | Payer: Medicaid Other | Source: Ambulatory Visit

## 2020-07-17 ENCOUNTER — Ambulatory Visit (INDEPENDENT_AMBULATORY_CARE_PROVIDER_SITE_OTHER): Payer: Medicaid Other

## 2020-07-17 ENCOUNTER — Ambulatory Visit (INDEPENDENT_AMBULATORY_CARE_PROVIDER_SITE_OTHER): Payer: Medicaid Other | Admitting: Podiatry

## 2020-07-17 ENCOUNTER — Other Ambulatory Visit: Payer: Self-pay

## 2020-07-17 DIAGNOSIS — M722 Plantar fascial fibromatosis: Secondary | ICD-10-CM

## 2020-07-21 ENCOUNTER — Encounter: Payer: Self-pay | Admitting: Podiatry

## 2020-07-21 NOTE — Progress Notes (Signed)
  Subjective:  Patient ID: Vanessa Simmons, female    DOB: [birth year],  MRN: [id number]  Chief Complaint  Patient presents with  . Foot Pain    Bilateral foot pain PT stated that by the end of the day she has a horrible throbbing sensation in her arch.     45 y.o. female presents with the above complaint.  Patient presents with complaint of bilateral heel pain that has been on for quite some time.  Patient states that is painful to touch.  Patient states it hurts at the end of the day and throbbing pain in the arch.  Sometimes her toes feet will go numb as well.  She has not seen anyone else prior to see me.  She would like to discuss treatment options.  She has not tried any other treatment options her pain scale 7 out of 10 is sharp shooting in nature.   Review of Systems: Negative except as noted in the HPI. Denies N/V/F/Ch.  Past Medical History:  Diagnosis Date  . Hypertension   . Rh negative state in antepartum period 02/23/2018    Current Outpatient Medications:  .  acetaminophen (TYLENOL) 160 MG/5ML elixir, Take 325 mg by mouth every 4 (four) hours as needed for fever., Disp: , Rfl:  .  folic acid (FOLVITE) 1 MG tablet, Take 1 tablet (1 mg total) by mouth daily. (Patient not taking: Reported on 09/24/2018), Disp: 30 tablet, Rfl: 10 .  ibuprofen (ADVIL,MOTRIN) 600 MG tablet, Take 1 tablet (600 mg total) by mouth every 6 (six) hours as needed. (Patient not taking: Reported on 09/24/2018), Disp: 60 tablet, Rfl: 3 .  labetalol (NORMODYNE) 200 MG tablet, Take 1 tablet (200 mg total) by mouth 2 (two) times daily., Disp: 60 tablet, Rfl: 3  Social History   Tobacco Use  Smoking Status Former Smoker  Smokeless Tobacco Current User    No Known Allergies Objective:  There were no vitals filed for this visit. There is no height or weight on file to calculate BMI. Constitutional Well developed. Well nourished.  Vascular Dorsalis pedis pulses palpable bilaterally. Posterior tibial  pulses palpable bilaterally. Capillary refill normal to all digits.  No cyanosis or clubbing noted. Pedal hair growth normal.  Neurologic Normal speech. Oriented to person, place, and time. Epicritic sensation to light touch grossly present bilaterally.  Dermatologic Nails well groomed and normal in appearance. No open wounds. No skin lesions.  Orthopedic: Normal joint ROM without pain or crepitus bilaterally. No visible deformities. Tender to palpation at the calcaneal tuber bilaterally. No pain with calcaneal squeeze bilaterally. Ankle ROM diminished range of motion bilaterally. Silfverskiold Test: positive bilaterally.   Radiographs: Taken and reviewed. No acute fractures or dislocations. No evidence of stress fracture.  Plantar heel spur present. Posterior heel spur present.   Assessment:   1. Plantar fasciitis of right foot   2. Plantar fasciitis of left foot    Plan:  Patient was evaluated and treated and all questions answered.  Plantar Fasciitis, bilaterally - XR reviewed as above.  - Educated on icing and stretching. Instructions given.  - Injection delivered to the plantar fascia as below. - DME: Plantar Fascial Brace - Pharmacologic management: None  Procedure: Injection Tendon/Ligament Location: Bilateral plantar fascia at the glabrous junction; medial approach. Skin Prep: alcohol Injectate: 0.5 cc 0.5% marcaine plain, 0.5 cc of 1% Lidocaine, 0.5 cc kenalog 10. Disposition: Patient tolerated procedure well. Injection site dressed with a band-aid.  No follow-ups on file.

## 2020-08-14 ENCOUNTER — Ambulatory Visit: Payer: Medicaid Other | Admitting: Podiatry

## 2020-08-28 ENCOUNTER — Ambulatory Visit: Payer: Medicaid Other | Admitting: Podiatry

## 2020-09-04 ENCOUNTER — Ambulatory Visit
Admission: RE | Admit: 2020-09-04 | Discharge: 2020-09-04 | Disposition: A | Discharge status: Home / Self Care | Payer: Medicaid Other | Source: Ambulatory Visit | Attending: Endocrinology | Admitting: Endocrinology

## 2020-09-04 ENCOUNTER — Other Ambulatory Visit: Payer: Self-pay

## 2020-09-04 DIAGNOSIS — Z1231 Encounter for screening mammogram for malignant neoplasm of breast: Secondary | ICD-10-CM

## 2020-09-07 ENCOUNTER — Other Ambulatory Visit: Payer: Self-pay | Admitting: Endocrinology

## 2020-09-07 DIAGNOSIS — R928 Other abnormal and inconclusive findings on diagnostic imaging of breast: Secondary | ICD-10-CM

## 2020-09-11 ENCOUNTER — Other Ambulatory Visit: Payer: Self-pay

## 2020-09-11 ENCOUNTER — Ambulatory Visit (INDEPENDENT_AMBULATORY_CARE_PROVIDER_SITE_OTHER): Payer: Medicaid Other | Admitting: Podiatry

## 2020-09-11 DIAGNOSIS — M722 Plantar fascial fibromatosis: Secondary | ICD-10-CM

## 2020-09-16 ENCOUNTER — Encounter: Payer: Self-pay | Admitting: Podiatry

## 2020-09-16 NOTE — Progress Notes (Signed)
  Subjective:  Patient ID: Vanessa Simmons, female    DOB: 03/04/1976,  MRN: [id number]  Chief Complaint  Patient presents with  . Plantar Fasciitis    PT stated that she is still having some issues with her right foot     45 y.o. female presents with the above complaint.  Patient presents with follow-up of bilateral plantar fasciitis.  She states that it is doing much better she is about 9095% improved.  She would like to discuss options to prevent this from recurring.  She denies any other acute complaints.   Review of Systems: Negative except as noted in the HPI. Denies N/V/F/Ch.  Past Medical History:  Diagnosis Date  . Hypertension   . Rh negative state in antepartum period 02/23/2018    Current Outpatient Medications:  .  acetaminophen (TYLENOL) 160 MG/5ML elixir, Take 325 mg by mouth every 4 (four) hours as needed for fever., Disp: , Rfl:  .  folic acid (FOLVITE) 1 MG tablet, Take 1 tablet (1 mg total) by mouth daily. (Patient not taking: Reported on 09/24/2018), Disp: 30 tablet, Rfl: 10 .  ibuprofen (ADVIL,MOTRIN) 600 MG tablet, Take 1 tablet (600 mg total) by mouth every 6 (six) hours as needed. (Patient not taking: Reported on 09/24/2018), Disp: 60 tablet, Rfl: 3 .  labetalol (NORMODYNE) 200 MG tablet, Take 1 tablet (200 mg total) by mouth 2 (two) times daily., Disp: 60 tablet, Rfl: 3  Social History   Tobacco Use  Smoking Status Former Smoker  Smokeless Tobacco Current User    No Known Allergies Objective:  There were no vitals filed for this visit. There is no height or weight on file to calculate BMI. Constitutional Well developed. Well nourished.  Vascular Dorsalis pedis pulses palpable bilaterally. Posterior tibial pulses palpable bilaterally. Capillary refill normal to all digits.  No cyanosis or clubbing noted. Pedal hair growth normal.  Neurologic Normal speech. Oriented to person, place, and time. Epicritic sensation to light touch grossly present  bilaterally.  Dermatologic Nails well groomed and normal in appearance. No open wounds. No skin lesions.  Orthopedic: Normal joint ROM without pain or crepitus bilaterally. No visible deformities. No tender to palpation at the calcaneal tuber bilaterally. No pain with calcaneal squeeze bilaterally. Ankle ROM diminished range of motion bilaterally. Silfverskiold Test: positive bilaterally.   Radiographs: Taken and reviewed. No acute fractures or dislocations. No evidence of stress fracture.  Plantar heel spur present. Posterior heel spur present.   Assessment:   1. Plantar fasciitis of right foot   2. Plantar fasciitis of left foot    Plan:  Patient was evaluated and treated and all questions answered.  Plantar Fasciitis, bilaterally - Clinically healed with a steroid injection.  At this time I discussed shoe gear modification as well as over-the-counter versus custom orthotics.  Due to financial restriction we will discuss custom over-the-counter orthotics for now.  She may benefit from power steps.  I encouraged her to obtain those.  She states understanding will do so immediately.  If there is no resolve meant we can discuss custom orthotics at that time.  No follow-ups on file.

## 2020-09-25 ENCOUNTER — Ambulatory Visit
Admission: RE | Admit: 2020-09-25 | Discharge: 2020-09-25 | Disposition: A | Discharge status: Home / Self Care | Payer: Medicaid Other | Source: Ambulatory Visit | Attending: Endocrinology | Admitting: Endocrinology

## 2020-09-25 ENCOUNTER — Other Ambulatory Visit: Payer: Self-pay

## 2020-09-25 DIAGNOSIS — R928 Other abnormal and inconclusive findings on diagnostic imaging of breast: Secondary | ICD-10-CM

## 2020-11-25 ENCOUNTER — Ambulatory Visit (INDEPENDENT_AMBULATORY_CARE_PROVIDER_SITE_OTHER): Payer: Medicaid Other | Admitting: Podiatry

## 2020-11-25 ENCOUNTER — Other Ambulatory Visit: Payer: Self-pay

## 2020-11-25 DIAGNOSIS — M722 Plantar fascial fibromatosis: Secondary | ICD-10-CM

## 2020-12-01 ENCOUNTER — Encounter: Payer: Self-pay | Admitting: Podiatry

## 2020-12-01 NOTE — Progress Notes (Signed)
  Subjective:  Patient ID: Vanessa Simmons, female    DOB: 12-23-1975,  MRN: [id number]  Chief Complaint  Patient presents with   Plantar Fasciitis    PT would like an injection in both feet today     45 y.o. female presents with the above complaint.  Patient presents for follow-up of bilateral plantar fasciitis.  Patient states that the pain came back again.  She states that it started to hurt again.  She would like to do another set of injections.  She denies any other acute complaints she has not been wearing good orthotics.   Review of Systems: Negative except as noted in the HPI. Denies N/V/F/Ch.  Past Medical History:  Diagnosis Date   Hypertension    Rh negative state in antepartum period 02/23/2018    Current Outpatient Medications:    acetaminophen (TYLENOL) 160 MG/5ML elixir, Take 325 mg by mouth every 4 (four) hours as needed for fever., Disp: , Rfl:    folic acid (FOLVITE) 1 MG tablet, Take 1 tablet (1 mg total) by mouth daily. (Patient not taking: Reported on 09/24/2018), Disp: 30 tablet, Rfl: 10   ibuprofen (ADVIL,MOTRIN) 600 MG tablet, Take 1 tablet (600 mg total) by mouth every 6 (six) hours as needed. (Patient not taking: Reported on 09/24/2018), Disp: 60 tablet, Rfl: 3   labetalol (NORMODYNE) 200 MG tablet, Take 1 tablet (200 mg total) by mouth 2 (two) times daily., Disp: 60 tablet, Rfl: 3  Social History   Tobacco Use  Smoking Status Former  Smokeless Tobacco Current    No Known Allergies Objective:  There were no vitals filed for this visit. There is no height or weight on file to calculate BMI. Constitutional Well developed. Well nourished.  Vascular Dorsalis pedis pulses palpable bilaterally. Posterior tibial pulses palpable bilaterally. Capillary refill normal to all digits.  No cyanosis or clubbing noted. Pedal hair growth normal.  Neurologic Normal speech. Oriented to person, place, and time. Epicritic sensation to light touch grossly present  bilaterally.  Dermatologic Nails well groomed and normal in appearance. No open wounds. No skin lesions.  Orthopedic: Normal joint ROM without pain or crepitus bilaterally. No visible deformities. Tender to palpation at the calcaneal tuber bilaterally. No pain with calcaneal squeeze bilaterally. Ankle ROM diminished range of motion bilaterally. Silfverskiold Test: positive bilaterally.   Radiographs: Taken and reviewed. No acute fractures or dislocations. No evidence of stress fracture.  Plantar heel spur present. Posterior heel spur present.   Assessment:   1. Plantar fasciitis of right foot   2. Plantar fasciitis of left foot     Plan:  Patient was evaluated and treated and all questions answered.  Plantar Fasciitis, bilaterally - XR reviewed as above.  - Educated on icing and stretching. Instructions given.  - Injection delivered to the plantar fascia as below. - DME: Plantar Fascial Brace - Pharmacologic management: None  Procedure: Injection Tendon/Ligament Location: Bilateral plantar fascia at the glabrous junction; medial approach. Skin Prep: alcohol Injectate: 0.5 cc 0.5% marcaine plain, 0.5 cc of 1% Lidocaine, 0.5 cc kenalog 10. Disposition: Patient tolerated procedure well. Injection site dressed with a band-aid.  No follow-ups on file.

## 2020-12-11 ENCOUNTER — Ambulatory Visit: Payer: Medicaid Other | Admitting: Podiatry

## 2020-12-25 ENCOUNTER — Ambulatory Visit (INDEPENDENT_AMBULATORY_CARE_PROVIDER_SITE_OTHER): Payer: Medicaid Other | Admitting: Podiatry

## 2020-12-25 ENCOUNTER — Other Ambulatory Visit: Payer: Self-pay

## 2020-12-25 DIAGNOSIS — M722 Plantar fascial fibromatosis: Secondary | ICD-10-CM

## 2020-12-30 ENCOUNTER — Encounter: Payer: Self-pay | Admitting: Podiatry

## 2020-12-30 NOTE — Progress Notes (Signed)
  Subjective:  Patient ID: Vanessa Simmons, female    DOB: 12-Aug-1975,  MRN: [id number]  Chief Complaint  Patient presents with   Plantar Fasciitis    Bilateral follow-up. Right foot is much better but left is still in a lot of pain at this time.     45 y.o. female presents with the above complaint.  Patient presents for follow-up of bilateral plantar fasciitis.  She states she no longer has any pain on the right side.  She still has pain to the left Planter fasciitis.  She states the pain is almost gone she has gone power steps inserts which seems to help a lot.  She denies any other acute complaints.   Review of Systems: Negative except as noted in the HPI. Denies N/V/F/Ch.  Past Medical History:  Diagnosis Date   Hypertension    Rh negative state in antepartum period 02/23/2018    Current Outpatient Medications:    acetaminophen (TYLENOL) 160 MG/5ML elixir, Take 325 mg by mouth every 4 (four) hours as needed for fever., Disp: , Rfl:    folic acid (FOLVITE) 1 MG tablet, Take 1 tablet (1 mg total) by mouth daily. (Patient not taking: Reported on 09/24/2018), Disp: 30 tablet, Rfl: 10   ibuprofen (ADVIL,MOTRIN) 600 MG tablet, Take 1 tablet (600 mg total) by mouth every 6 (six) hours as needed. (Patient not taking: Reported on 09/24/2018), Disp: 60 tablet, Rfl: 3   labetalol (NORMODYNE) 200 MG tablet, Take 1 tablet (200 mg total) by mouth 2 (two) times daily., Disp: 60 tablet, Rfl: 3  Social History   Tobacco Use  Smoking Status Former  Smokeless Tobacco Current    No Known Allergies Objective:  There were no vitals filed for this visit. There is no height or weight on file to calculate BMI. Constitutional Well developed. Well nourished.  Vascular Dorsalis pedis pulses palpable bilaterally. Posterior tibial pulses palpable bilaterally. Capillary refill normal to all digits.  No cyanosis or clubbing noted. Pedal hair growth normal.  Neurologic Normal speech. Oriented to  person, place, and time. Epicritic sensation to light touch grossly present bilaterally.  Dermatologic Nails well groomed and normal in appearance. No open wounds. No skin lesions.  Orthopedic: Normal joint ROM without pain or crepitus bilaterally. No visible deformities. Tender to palpation at the calcaneal tuber left.  No pain to the calcaneal tuber on the right side No pain with calcaneal squeeze bilaterally. Ankle ROM diminished range of motion bilaterally. Silfverskiold Test: positive bilaterally.   Radiographs: Taken and reviewed. No acute fractures or dislocations. No evidence of stress fracture.  Plantar heel spur present. Posterior heel spur present.   Assessment:   1. Plantar fasciitis of right foot   2. Plantar fasciitis of left foot      Plan:  Patient was evaluated and treated and all questions answered.  Right Planter fasciitis -Clinically healed  Plantar Fasciitis, left - XR reviewed as above.  - Educated on icing and stretching. Instructions given.  -Thyroid injection delivered to the plantar fascia as below. - DME: Continues in plantar fascial brace - Pharmacologic management: None  Procedure: Injection Tendon/Ligament Location: Bilateral plantar fascia at the glabrous junction; medial approach. Skin Prep: alcohol Injectate: 0.5 cc 0.5% marcaine plain, 0.5 cc of 1% Lidocaine, 0.5 cc kenalog 10. Disposition: Patient tolerated procedure well. Injection site dressed with a band-aid.  No follow-ups on file.

## 2021-01-22 ENCOUNTER — Other Ambulatory Visit: Payer: Self-pay

## 2021-01-22 ENCOUNTER — Ambulatory Visit (INDEPENDENT_AMBULATORY_CARE_PROVIDER_SITE_OTHER): Payer: Medicaid Other | Admitting: Podiatry

## 2021-01-22 DIAGNOSIS — M722 Plantar fascial fibromatosis: Secondary | ICD-10-CM

## 2021-01-22 NOTE — Progress Notes (Signed)
  Subjective:  Patient ID: Vanessa Simmons, female    DOB: 09/19/1975,  MRN: [id number]  Chief Complaint  Patient presents with   Plantar Fasciitis    PT stated that she is doing a lot better     45 y.o. female presents with the above complaint.  Patient presents for follow-up of bilateral plantar fasciitis.  She states she is doing a lot better no pain in both of the heels.  The injection helped considerably.  She is still working on making shoe changes.  She denies any other acute complaints.   Review of Systems: Negative except as noted in the HPI. Denies N/V/F/Ch.  Past Medical History:  Diagnosis Date   Hypertension    Rh negative state in antepartum period 02/23/2018    Current Outpatient Medications:    acetaminophen (TYLENOL) 160 MG/5ML elixir, Take 325 mg by mouth every 4 (four) hours as needed for fever., Disp: , Rfl:    folic acid (FOLVITE) 1 MG tablet, Take 1 tablet (1 mg total) by mouth daily. (Patient not taking: Reported on 09/24/2018), Disp: 30 tablet, Rfl: 10   ibuprofen (ADVIL,MOTRIN) 600 MG tablet, Take 1 tablet (600 mg total) by mouth every 6 (six) hours as needed. (Patient not taking: Reported on 09/24/2018), Disp: 60 tablet, Rfl: 3   labetalol (NORMODYNE) 200 MG tablet, Take 1 tablet (200 mg total) by mouth 2 (two) times daily., Disp: 60 tablet, Rfl: 3  Social History   Tobacco Use  Smoking Status Former  Smokeless Tobacco Current    No Known Allergies Objective:  There were no vitals filed for this visit. There is no height or weight on file to calculate BMI. Constitutional Well developed. Well nourished.  Vascular Dorsalis pedis pulses palpable bilaterally. Posterior tibial pulses palpable bilaterally. Capillary refill normal to all digits.  No cyanosis or clubbing noted. Pedal hair growth normal.  Neurologic Normal speech. Oriented to person, place, and time. Epicritic sensation to light touch grossly present bilaterally.  Dermatologic Nails well  groomed and normal in appearance. No open wounds. No skin lesions.  Orthopedic: Normal joint ROM without pain or crepitus bilaterally. No visible deformities. No further tender to palpation at the calcaneal tuber left.  No pain to the calcaneal tuber on the right side No pain with calcaneal squeeze bilaterally. Ankle ROM diminished range of motion bilaterally. Silfverskiold Test: positive bilaterally.   Radiographs: Taken and reviewed. No acute fractures or dislocations. No evidence of stress fracture.  Plantar heel spur present. Posterior heel spur present.   Assessment:   1. Plantar fasciitis of right foot   2. Plantar fasciitis of left foot       Plan:  Patient was evaluated and treated and all questions answered.  Right Planter fasciitis -Clinically healed  Plantar Fasciitis, left -Clinically healed in both feet.  At this time I read discussed shoe gear modification as well as over-the-counter orthotics.  She states that she has obtained power steps and has been functioning well and it seems to help a lot support.  If any foot and ankle issues arise in future I will asked her to come see me.  She states understanding  No follow-ups on file.

## 2021-03-17 ENCOUNTER — Ambulatory Visit: Payer: Medicaid Other | Admitting: Podiatry

## 2021-08-24 ENCOUNTER — Other Ambulatory Visit: Payer: Self-pay | Admitting: Endocrinology

## 2021-08-24 DIAGNOSIS — Z1231 Encounter for screening mammogram for malignant neoplasm of breast: Secondary | ICD-10-CM

## 2021-09-01 ENCOUNTER — Ambulatory Visit (INDEPENDENT_AMBULATORY_CARE_PROVIDER_SITE_OTHER): Payer: Medicaid Other | Admitting: Podiatry

## 2021-09-01 DIAGNOSIS — M7661 Achilles tendinitis, right leg: Secondary | ICD-10-CM

## 2021-09-07 ENCOUNTER — Ambulatory Visit
Admission: RE | Admit: 2021-09-07 | Discharge: 2021-09-07 | Disposition: A | Discharge status: Home / Self Care | Payer: Medicaid Other | Source: Ambulatory Visit | Attending: Endocrinology | Admitting: Endocrinology

## 2021-09-07 DIAGNOSIS — Z1231 Encounter for screening mammogram for malignant neoplasm of breast: Secondary | ICD-10-CM

## 2021-09-07 NOTE — Progress Notes (Signed)
?  Subjective:  ?Patient ID: Vanessa Simmons, female    DOB: October 31, 1975,  MRN: [id number] ? ?Chief Complaint  ?Patient presents with  ? Plantar Fasciitis  ? ? ?46 y.o. female presents with the above complaint.  Patient presents with mild complaint of right Achilles tendinitis with callus formation.  She states that she is noticing this callus in the back part of the heel.  Hurts with ambulation.  She states that her shoes are starting to wear out and she believes that might be the cause of it she just wanted to get it evaluated.  She has not seen anyone else prior to seeing me for this.  She denies any other acute complaints her pain is very minimal ? ? ?Review of Systems: Negative except as noted in the HPI. Denies N/V/F/Ch. ? ?Past Medical History:  ?Diagnosis Date  ? Hypertension   ? Rh negative state in antepartum period 02/23/2018  ? ? ?Current Outpatient Medications:  ?  acetaminophen (TYLENOL) 160 MG/5ML elixir, Take 325 mg by mouth every 4 (four) hours as needed for fever., Disp: , Rfl:  ?  folic acid (FOLVITE) 1 MG tablet, Take 1 tablet (1 mg total) by mouth daily. (Patient not taking: Reported on 09/24/2018), Disp: 30 tablet, Rfl: 10 ?  ibuprofen (ADVIL,MOTRIN) 600 MG tablet, Take 1 tablet (600 mg total) by mouth every 6 (six) hours as needed. (Patient not taking: Reported on 09/24/2018), Disp: 60 tablet, Rfl: 3 ?  labetalol (NORMODYNE) 200 MG tablet, Take 1 tablet (200 mg total) by mouth 2 (two) times daily., Disp: 60 tablet, Rfl: 3 ? ?Social History  ? ?Tobacco Use  ?Smoking Status Former  ?Smokeless Tobacco Current  ? ? ?No Known Allergies ?Objective:  ?There were no vitals filed for this visit. ?There is no height or weight on file to calculate BMI. ?Constitutional Well developed. ?Well nourished.  ?Vascular Dorsalis pedis pulses palpable bilaterally. ?Posterior tibial pulses palpable bilaterally. ?Capillary refill normal to all digits.  ?No cyanosis or clubbing noted. ?Pedal hair growth normal.   ?Neurologic Normal speech. ?Oriented to person, place, and time. ?Epicritic sensation to light touch grossly present bilaterally.  ?Dermatologic Nails well groomed and normal in appearance. ?No open wounds. ?No skin lesions.  ?Orthopedic: Mild pain on palpation to the right Achilles tendon.  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  Callus formation/hyperkeratotic lesion noted to the posterior heel.  ? ?Radiographs:  ?Assessment:  ? ?1. Achilles tendinitis, right leg   ? ?Plan:  ?Patient was evaluated and treated and all questions answered. ? ?Right Achilles tendinitis ?-All questions and concerns were discussed with the patient in extensive detail.  I discussed shoe gear modification immediately as her current shoes are completely worn out leading to excessive stress and pressure back there.  Ultimately if this continues to get worse patient may benefit from injection versus immobilization versus surgical options.  I briefly discussed that she states understanding. ?-She will also benefit from heel lifts ? ?No follow-ups on file.  ?

## 2022-07-21 ENCOUNTER — Other Ambulatory Visit: Payer: Self-pay | Admitting: Family Medicine

## 2022-07-21 DIAGNOSIS — Z1231 Encounter for screening mammogram for malignant neoplasm of breast: Secondary | ICD-10-CM

## 2022-09-09 ENCOUNTER — Ambulatory Visit
Admission: RE | Admit: 2022-09-09 | Discharge: 2022-09-09 | Disposition: A | Discharge status: Home / Self Care | Payer: Medicaid Other | Source: Ambulatory Visit | Attending: Family Medicine | Admitting: Family Medicine

## 2022-09-09 DIAGNOSIS — Z1231 Encounter for screening mammogram for malignant neoplasm of breast: Secondary | ICD-10-CM

## 2023-07-16 IMAGING — MG MM DIGITAL SCREENING BILAT W/ TOMO AND CAD
8 series · 8 of 24 positions shown · non-contrast
Comparison: Priors

CLINICAL DATA: Screening.

EXAM:
DIGITAL SCREENING BILATERAL MAMMOGRAM WITH TOMOSYNTHESIS AND CAD
TECHNIQUE: Bilateral screening digital craniocaudal and mediolateral oblique
mammograms were obtained. Bilateral screening digital breast
tomosynthesis was performed. The images were evaluated with
computer-aided detection.

[L MLO synth-2D]
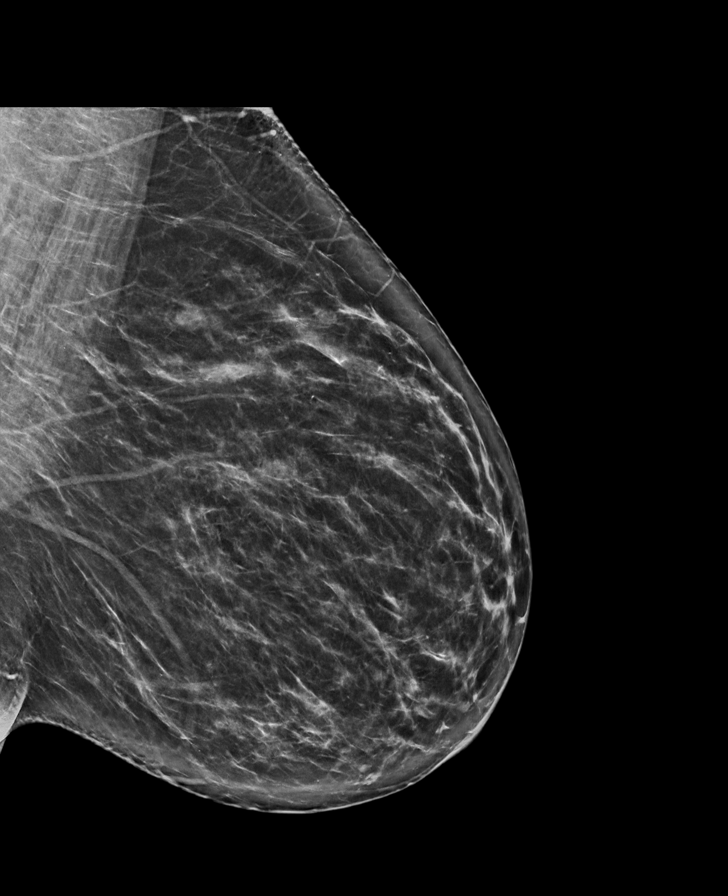

[R CC synth-2D]
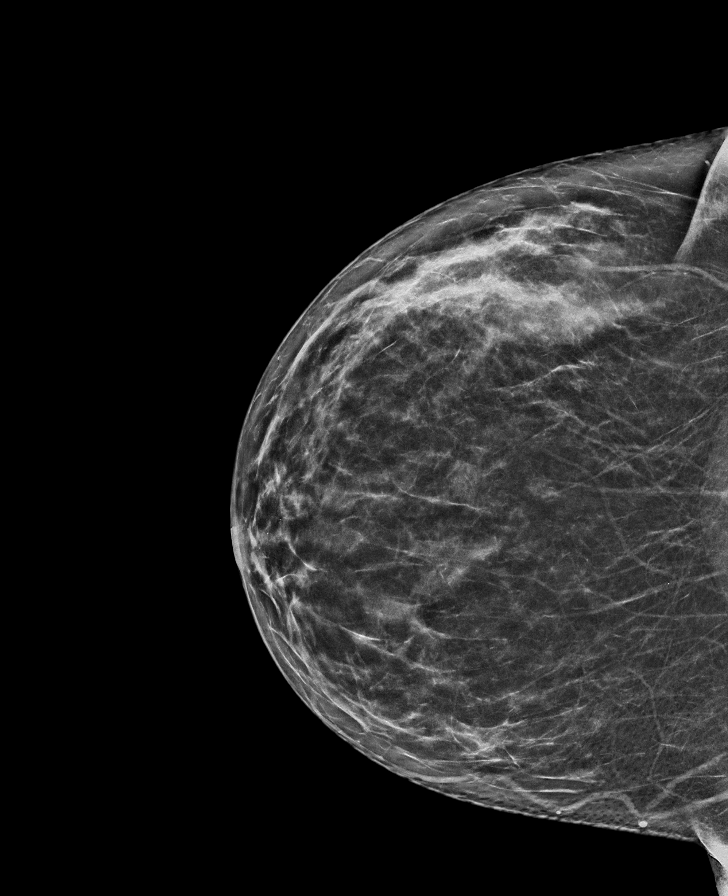

[L CC synth-2D]
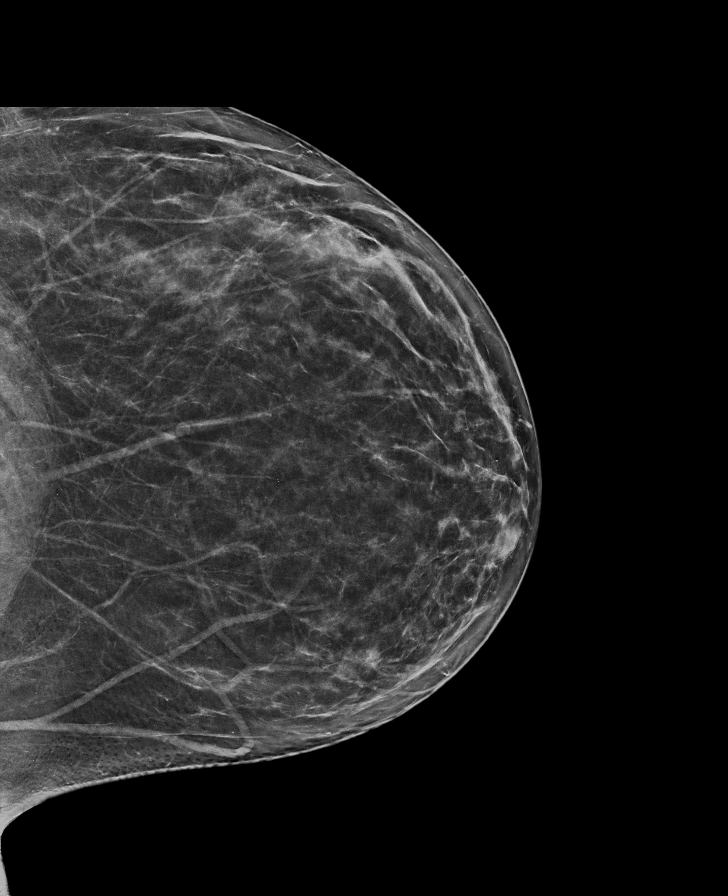

[R MLO synth-2D]
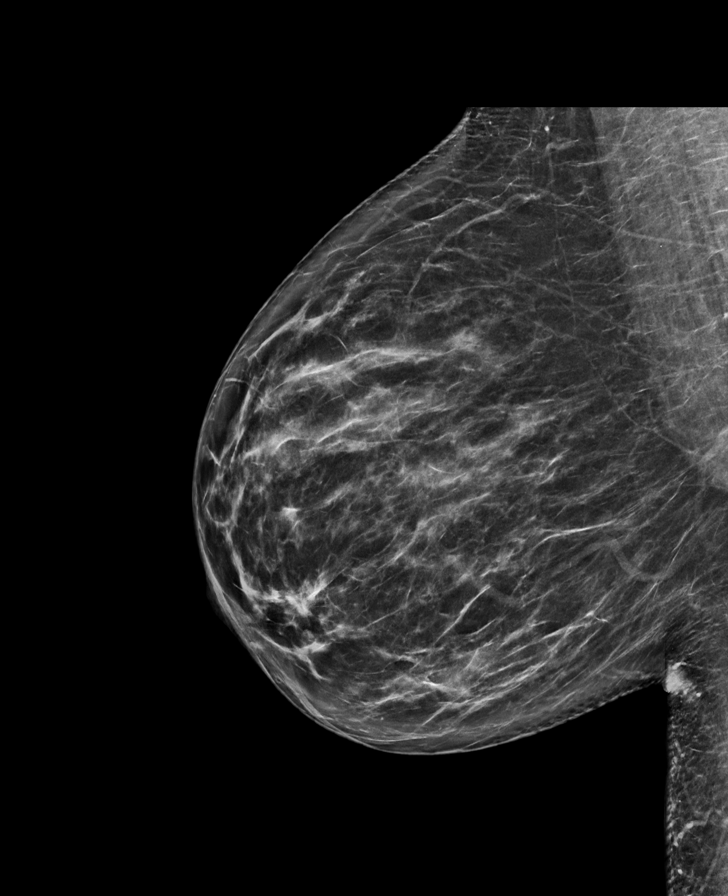

[L CC tomo · tomo slice 39/76.0]
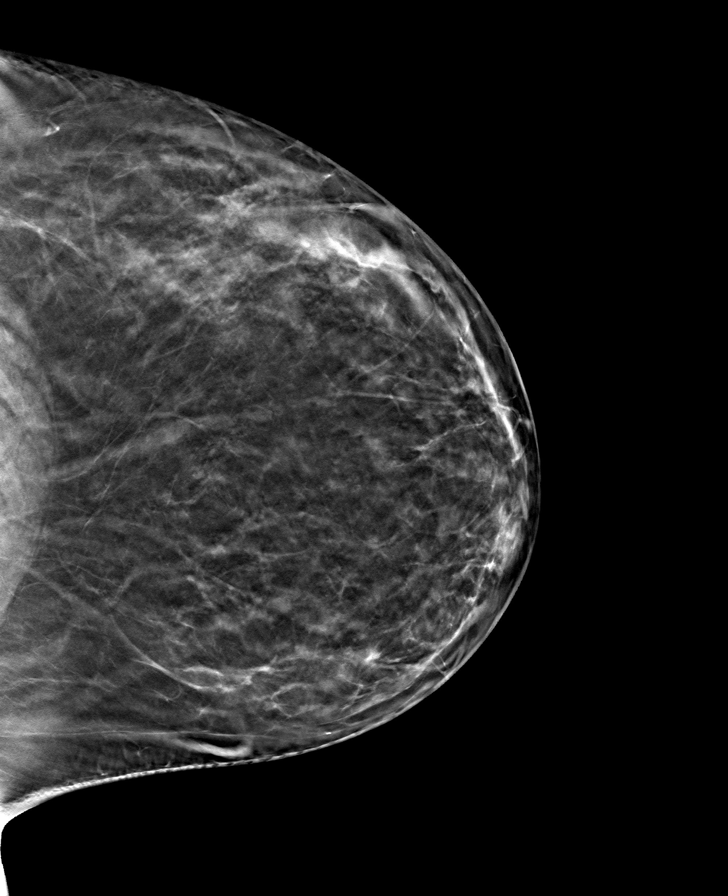

[L MLO tomo · tomo slice 41/80.0]
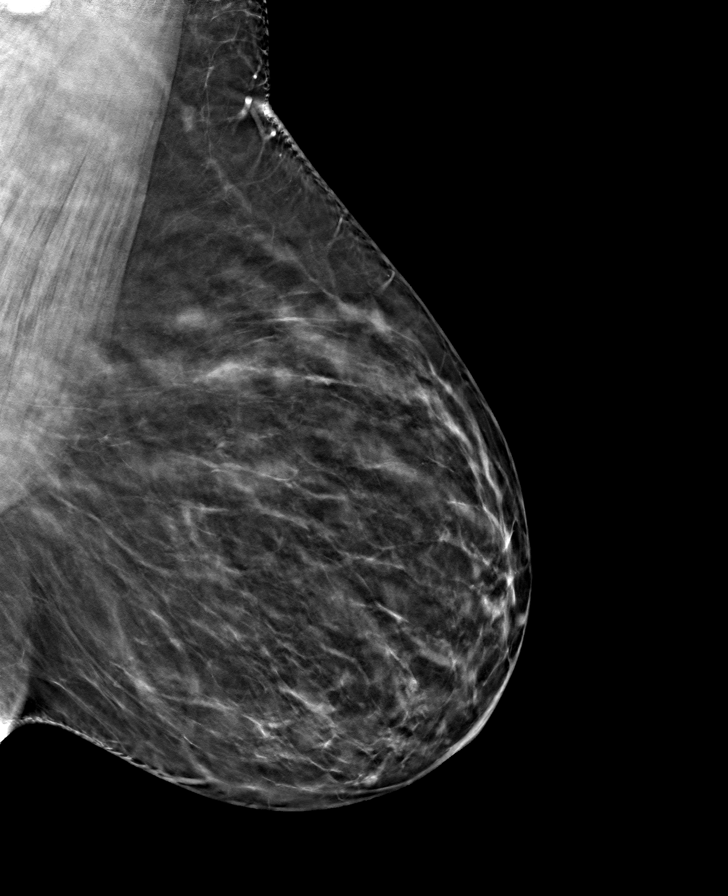

[R CC tomo · tomo slice 37/73.0]
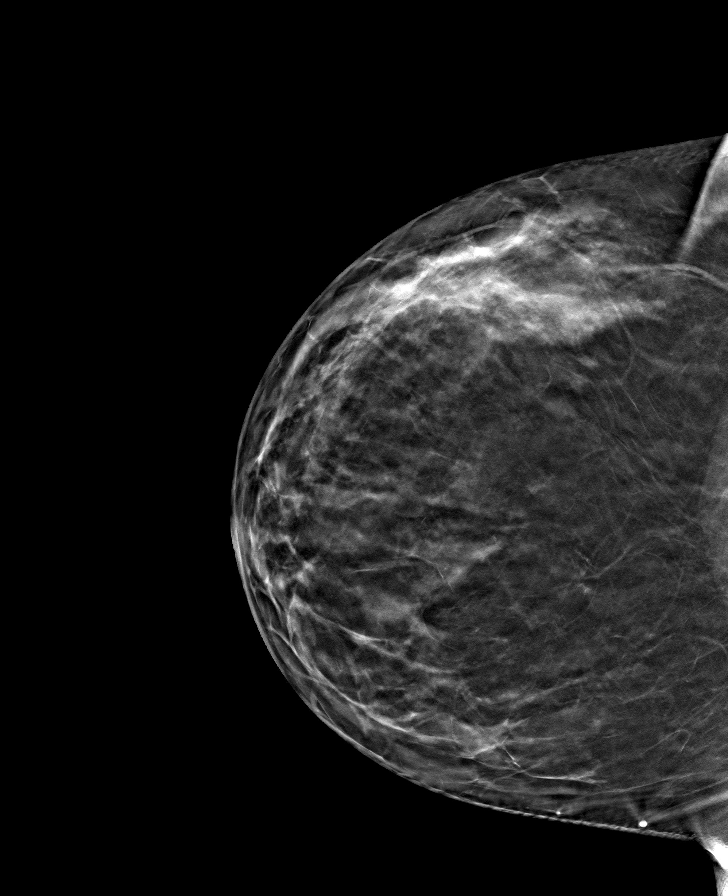

[R MLO tomo · tomo slice 42/83.0]
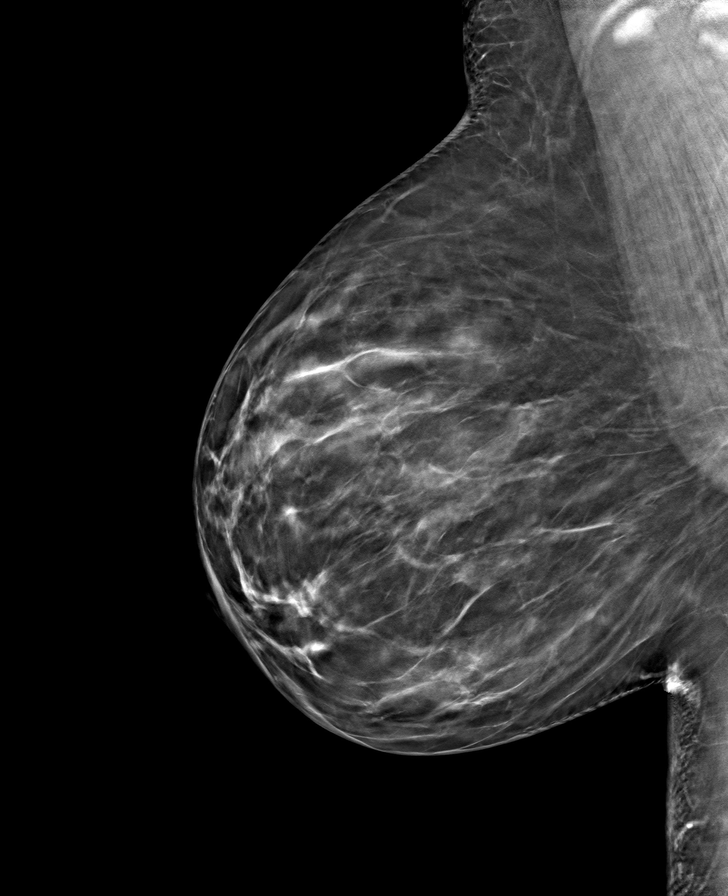

[8 of 24 positions shown; findings below may reference images not displayed]

ACR Breast Density Category b: There are scattered areas of
fibroglandular density.
FINDINGS: There are no findings suspicious for malignancy.
IMPRESSION: No mammographic evidence of malignancy. A result letter of this
screening mammogram will be mailed directly to the patient.

RECOMMENDATION:
Screening mammogram in one year. (Code:7Y-2-OBP)

BI-RADS CATEGORY  1: Negative.

## 2023-08-08 ENCOUNTER — Other Ambulatory Visit: Payer: Self-pay | Admitting: Family Medicine

## 2023-08-08 DIAGNOSIS — Z1231 Encounter for screening mammogram for malignant neoplasm of breast: Secondary | ICD-10-CM

## 2023-09-12 ENCOUNTER — Ambulatory Visit
Admission: RE | Admit: 2023-09-12 | Discharge: 2023-09-12 | Disposition: A | Discharge status: Home / Self Care | Source: Ambulatory Visit | Attending: Family Medicine | Admitting: Family Medicine

## 2023-09-12 ENCOUNTER — Ambulatory Visit

## 2023-09-12 DIAGNOSIS — Z1231 Encounter for screening mammogram for malignant neoplasm of breast: Secondary | ICD-10-CM

## 2023-09-14 ENCOUNTER — Other Ambulatory Visit: Payer: Self-pay | Admitting: Family Medicine

## 2023-09-14 DIAGNOSIS — R928 Other abnormal and inconclusive findings on diagnostic imaging of breast: Secondary | ICD-10-CM

## 2023-09-29 ENCOUNTER — Ambulatory Visit

## 2023-09-29 ENCOUNTER — Ambulatory Visit
Admission: RE | Admit: 2023-09-29 | Discharge: 2023-09-29 | Disposition: A | Discharge status: Home / Self Care | Source: Ambulatory Visit | Attending: Family Medicine | Admitting: Family Medicine

## 2023-09-29 DIAGNOSIS — R928 Other abnormal and inconclusive findings on diagnostic imaging of breast: Secondary | ICD-10-CM
